# Patient Record
Sex: Female | Born: 1965 | Race: White | Hispanic: No | Marital: Married
Health system: Midwestern US, Academic
[De-identification: ages and names within clinical notes are randomized; demographics above are authoritative.]

---

## 2018-01-30 ENCOUNTER — Encounter: Admit: 2018-01-30 | Discharge: 2018-01-30

## 2018-01-30 ENCOUNTER — Inpatient Hospital Stay: Admit: 2018-01-30 | Discharge: 2018-01-30

## 2018-01-30 ENCOUNTER — Inpatient Hospital Stay
Admit: 2018-01-30 | Discharge: 2018-01-31 | Disposition: A | Discharge status: Home / Self Care | Source: Other Acute Inpatient Hospital | Attending: Neurology | Admitting: Neurology

## 2018-01-30 DIAGNOSIS — R531 Weakness: ICD-10-CM

## 2018-01-30 MED ORDER — ASPIRIN 81 MG PO CHEW
81 mg | Freq: Every day | ORAL | 0 refills | Status: DC
Start: 2018-01-30 — End: 2018-01-31
  Administered 2018-01-31: 14:00:00 81 mg via ORAL

## 2018-01-30 MED ORDER — NICOTINE 21 MG/24 HR TD PT24
1 | Freq: Every day | TRANSDERMAL | 0 refills | Status: DC
Start: 2018-01-30 — End: 2018-01-31
  Administered 2018-01-30 – 2018-01-31 (×2): 1 via TRANSDERMAL

## 2018-01-30 MED ORDER — IOHEXOL 350 MG IODINE/ML IV SOLN
100 mL | Freq: Once | INTRAVENOUS | 0 refills | Status: CP
Start: 2018-01-30 — End: ?
  Administered 2018-01-30: 20:00:00 100 mL via INTRAVENOUS

## 2018-01-30 MED ORDER — TRAZODONE 50 MG PO TAB
100 mg | Freq: Every evening | ORAL | 0 refills | Status: DC
Start: 2018-01-30 — End: 2018-01-31
  Administered 2018-01-31: 02:00:00 100 mg via ORAL

## 2018-01-30 MED ORDER — PERFLUTREN LIPID MICROSPHERES 1.1 MG/ML IV SUSP
1-20 mL | Freq: Once | INTRAVENOUS | 0 refills | Status: CP | PRN
Start: 2018-01-30 — End: ?
  Administered 2018-01-30: 22:00:00 2 mL via INTRAVENOUS

## 2018-01-30 MED ORDER — SODIUM CHLORIDE 0.9 % IJ SOLN
50 mL | Freq: Once | INTRAVENOUS | 0 refills | Status: CP
Start: 2018-01-30 — End: ?
  Administered 2018-01-30: 20:00:00 50 mL via INTRAVENOUS

## 2018-01-30 MED ORDER — BUPROPION XL 150 MG PO TB24
300 mg | Freq: Every day | ORAL | 0 refills | Status: DC
Start: 2018-01-30 — End: 2018-01-31
  Administered 2018-01-30 – 2018-01-31 (×2): 300 mg via ORAL

## 2018-01-30 MED ORDER — LORAZEPAM 2 MG/ML IJ SOLN
.5 mg | Freq: Once | INTRAVENOUS | 0 refills | Status: CP
Start: 2018-01-30 — End: ?
  Administered 2018-01-30: 23:00:00 0.5 mg via INTRAVENOUS

## 2018-01-30 MED ORDER — ENOXAPARIN 40 MG/0.4 ML SC SYRG
40 mg | Freq: Every day | SUBCUTANEOUS | 0 refills | Status: DC
Start: 2018-01-30 — End: 2018-01-31
  Administered 2018-01-30 – 2018-01-31 (×2): 40 mg via SUBCUTANEOUS

## 2018-01-30 MED ORDER — ACETAMINOPHEN 325 MG PO TAB
650 mg | ORAL | 0 refills | Status: DC | PRN
Start: 2018-01-30 — End: 2018-01-31
  Administered 2018-01-31 (×2): 650 mg via ORAL

## 2018-01-31 ENCOUNTER — Encounter: Admit: 2018-01-30 | Discharge: 2018-01-30

## 2018-01-31 ENCOUNTER — Encounter: Admit: 2018-01-31 | Discharge: 2018-01-31

## 2018-01-31 DIAGNOSIS — R471 Dysarthria and anarthria: ICD-10-CM

## 2018-01-31 DIAGNOSIS — M4722 Other spondylosis with radiculopathy, cervical region: Principal | ICD-10-CM

## 2018-01-31 DIAGNOSIS — R29810 Facial weakness: ICD-10-CM

## 2018-01-31 DIAGNOSIS — F17210 Nicotine dependence, cigarettes, uncomplicated: ICD-10-CM

## 2018-01-31 DIAGNOSIS — Z66 Do not resuscitate: ICD-10-CM

## 2018-01-31 DIAGNOSIS — R292 Abnormal reflex: ICD-10-CM

## 2018-01-31 DIAGNOSIS — R29704 NIHSS score 4: ICD-10-CM

## 2018-01-31 LAB — CBC: Lab: 3.9 K/UL — ABNORMAL LOW (ref >60)

## 2018-01-31 LAB — HEMOGLOBIN A1C: Lab: 5.9 % (ref 4.0–6.0)

## 2018-01-31 LAB — LIPID PROFILE: Lab: 196 mg/dL — ABNORMAL LOW (ref <200)

## 2018-01-31 LAB — BASIC METABOLIC PANEL: Lab: 140 MMOL/L — ABNORMAL LOW (ref <150)

## 2018-01-31 MED ORDER — ASPIRIN 81 MG PO CHEW
81 mg | ORAL_TABLET | Freq: Every day | ORAL | 3 refills | Status: AC
Start: 2018-01-31 — End: 2019-12-09

## 2018-01-31 MED ORDER — GABAPENTIN 300 MG PO CAP
300 mg | ORAL_CAPSULE | Freq: Every evening | ORAL | 3 refills | Status: AC
Start: 2018-01-31 — End: 2018-01-31

## 2018-01-31 MED ORDER — ASPIRIN 81 MG PO CHEW
81 mg | ORAL_TABLET | Freq: Every day | ORAL | 3 refills | Status: AC
Start: 2018-01-31 — End: 2018-01-31

## 2018-01-31 MED ORDER — NICOTINE 21 MG/24 HR TD PT24
1 | MEDICATED_PATCH | Freq: Every day | TRANSDERMAL | 2 refills | Status: AC
Start: 2018-01-31 — End: 2018-01-31

## 2018-01-31 MED ORDER — HYDROXYZINE HCL 25 MG PO TAB
100 mg | Freq: Once | ORAL | 0 refills | Status: CP
Start: 2018-01-31 — End: ?
  Administered 2018-01-31: 17:00:00 100 mg via ORAL

## 2018-01-31 MED ORDER — GABAPENTIN 300 MG PO CAP
300 mg | ORAL_CAPSULE | Freq: Every evening | ORAL | 3 refills | Status: AC
Start: 2018-01-31 — End: 2019-12-09
  Filled 2018-01-31 (×2): qty 90, 33d supply, fill #1

## 2018-01-31 MED ORDER — NICOTINE 21 MG/24 HR TD PT24
1 | MEDICATED_PATCH | Freq: Every day | TRANSDERMAL | 2 refills | Status: AC
Start: 2018-01-31 — End: 2019-12-04

## 2018-02-01 ENCOUNTER — Encounter: Admit: 2018-02-01 | Discharge: 2018-02-01

## 2018-02-02 ENCOUNTER — Encounter: Admit: 2018-02-02 | Discharge: 2018-02-02

## 2018-08-18 ENCOUNTER — Encounter: Admit: 2018-08-18 | Discharge: 2018-08-18

## 2019-10-11 ENCOUNTER — Encounter: Admit: 2019-10-11 | Discharge: 2019-10-11

## 2019-10-11 NOTE — Progress Notes
Social Work Case Management  Care Transitions    Plan. Social Work was consulted by the Case Management Office to assist Ms. Pinnock with transportation to and from a scheduled ENT office visit on 14 of December.     Interaction. Social Work contacted the individual that initiated the request: Dierdre Forth - Education officer, museum at the [employer]. Baxter Flattery indicated Ms. Bertucci does not have health insurance. Social Work informed Baxter Flattery - Ms. Bertucci will be responsible to pay the office visit fee and other itemized residual costs. Baxter Flattery indicated Ms. Busey cannot afford this.     Social Work also informed Baxter Flattery - Building surveyor at [employer] is emergency only.     Intervention. Considering Ms. Delgrande does not have health insurance - Social Work e-mailed the [employer] to screen for a referral to [employer] - to assist with applying for [place] Medicaid. Baxter Flattery will instruct Ms. Maclaren to contact the ENT clinic to reschedule and seek intermediate medical intervention at a local safety net clinic.     Dinah Beers, LMSW  Outpatient Social Worker  Pager [phone]

## 2019-12-03 ENCOUNTER — Encounter: Admit: 2019-12-03 | Discharge: 2019-12-03

## 2019-12-03 ENCOUNTER — Emergency Department: Admit: 2019-12-03 | Discharge: 2019-12-03

## 2019-12-03 DIAGNOSIS — H7190 Unspecified cholesteatoma, unspecified ear: Secondary | ICD-10-CM

## 2019-12-03 DIAGNOSIS — N760 Acute vaginitis: Secondary | ICD-10-CM

## 2019-12-03 DIAGNOSIS — R45851 Suicidal ideations: Secondary | ICD-10-CM

## 2019-12-03 DIAGNOSIS — B1920 Unspecified viral hepatitis C without hepatic coma: Secondary | ICD-10-CM

## 2019-12-03 LAB — URINALYSIS DIPSTICK
Lab: 6 (ref 5.0–8.0)
Lab: NEGATIVE
Lab: NEGATIVE
Lab: NEGATIVE
Lab: NEGATIVE
Lab: NEGATIVE
Lab: NEGATIVE
Lab: NEGATIVE

## 2019-12-03 LAB — URINALYSIS, MICROSCOPIC

## 2019-12-03 LAB — CBC AND DIFF
Lab: 0 % (ref 0–2)
Lab: 32 pg (ref 26–34)
Lab: 9.5 10*3/uL (ref 4.5–11.0)

## 2019-12-03 MED ORDER — METRONIDAZOLE 500 MG PO TAB
500 mg | ORAL_TABLET | Freq: Two times a day (BID) | ORAL | 0 refills | Status: DC
Start: 2019-12-03 — End: 2019-12-09

## 2019-12-03 MED ORDER — THIAMINE/FOLIC ACID IVPB
Freq: Once | INTRAVENOUS | 0 refills | Status: CP
Start: 2019-12-03 — End: ?
  Administered 2019-12-03 (×3): 50.000 mL via INTRAVENOUS

## 2019-12-03 MED ORDER — ACETAMINOPHEN 325 MG PO TAB
650 mg | Freq: Once | ORAL | 0 refills | Status: CP
Start: 2019-12-03 — End: ?
  Administered 2019-12-04: 650 mg via ORAL

## 2019-12-03 MED ORDER — METRONIDAZOLE 500 MG PO TAB
500 mg | Freq: Once | ORAL | 0 refills | Status: CP
Start: 2019-12-03 — End: ?
  Administered 2019-12-04: 03:00:00 500 mg via ORAL

## 2019-12-03 MED ORDER — LACTATED RINGERS IV SOLP
1000 mL | Freq: Once | INTRAVENOUS | 0 refills | Status: CP
Start: 2019-12-03 — End: ?
  Administered 2019-12-03: 1000 mL via INTRAVENOUS

## 2019-12-03 MED ORDER — LORAZEPAM 1 MG PO TAB
.5 mg | Freq: Once | ORAL | 0 refills | Status: CP
Start: 2019-12-03 — End: ?
  Administered 2019-12-04: 03:00:00 0.5 mg via ORAL

## 2019-12-03 NOTE — ED Provider Notes
Jennifer Hancock is a 54 y.o. female.    Chief Complaint:  Chief Complaint   Patient presents with   ? Suicidal     pt feeling suicidal and sent by therapist; couldn't afford antidepressants; having UTIs        History of Present Illness:  Jennifer Hancock is a 54 y.o. female with a history of hepatitis C who presents to the emergency department for suicidal ideation. Patient reports recently feeling suicidal with plan to overdose on medications, however, she states she does not have any medications at home she would be able to overdose on. She expresses wanting to be admitted to [facility] because a short term admission will not be long enough. Patient denies homicidal ideation. She does report hearing voices that are calling her a piece of shit. She has been prescribed wellbutrin, xanax, buspar and trazodone for her psychiatric illnesses in the past but has not been able to afford them the past 1 month. Patient reports drinking 4-8 alcoholic shooters per day for the past 3 weeks. She denies history of alcohol withdrawal or alcohol withdrawal seizures. Patient has history of IV drug use but denies this for a few years. Additionally, she complains of UTIs due to urinary frequency and dull throbbing pelvic pain for 1-2 weeks. She denies vaginal discharge. Patient was recently treated for trichomonas and states she could have an STD again.       History provided by:  Patient  Language interpreter used: No        Review of Systems:  Review of Systems   Constitutional: Negative for fever.   HENT: Negative for sore throat.    Eyes: Negative for visual disturbance.   Respiratory: Negative for cough and shortness of breath.    Cardiovascular: Negative for chest pain.   Gastrointestinal: Negative for abdominal pain, diarrhea, nausea and vomiting.   Genitourinary: Positive for flank pain (right), frequency and pelvic pain. Negative for dysuria, vaginal bleeding and vaginal discharge. Musculoskeletal: Negative for back pain.   Skin: Negative for rash.   Neurological: Negative for headaches.   Psychiatric/Behavioral: Positive for dysphoric mood and suicidal ideas. Negative for hallucinations and self-injury.       Allergies:  Ciprofloxacin (bulk)    Past Medical History:  Medical History:   Diagnosis Date   ? Cholesteatoma    ? Hepatitis C        Past Surgical History:  Surgical History:   Procedure Laterality Date   ? INNER EAR SURGERY         Pertinent medical/surgical history reviewed  Medical History:   Diagnosis Date   ? Cholesteatoma    ? Hepatitis C      Surgical History:   Procedure Laterality Date   ? INNER EAR SURGERY         Social History:  Social History     Tobacco Use   ? Smoking status: Current Every Day Smoker     Packs/day: 1.50   Substance Use Topics   ? Alcohol use: No   ? Drug use: No     Social History     Substance and Sexual Activity   Drug Use No       Family History:  History reviewed. No pertinent family history.    Vitals:  ED Vitals    Date and Time T BP P RR SPO2P SPO2 User   12/03/19 1900 -- 131/97 88 -- 81 100 % BR   12/03/19 1859 -- -- --  18 PER MINUTE -- -- BR   12/03/19 1830 -- 130/87 84 13 PER MINUTE 87 100 % NH   12/03/19 1800 -- -- 101 15 PER MINUTE 99 99 % NH   12/03/19 1730 -- 123/88 88 16 PER MINUTE 88 99 % NH   12/03/19 1700 -- 142/95 94 -- 94 98 % NH   12/03/19 1630 -- 132/90 99 -- 99 99 % NH   12/03/19 1606 -- 129/92 101 23 PER MINUTE 99 99 % NH   12/03/19 1544 36.3 ?C (97.4 ?F) 127/97 112 18 PER MINUTE -- 100 % SR          Physical Exam:  Physical Exam  Vitals signs and nursing note reviewed.   Constitutional:       Appearance: Normal appearance. She is normal weight.   HENT:      Head: Normocephalic and atraumatic.      Right Ear: External ear normal.      Left Ear: External ear normal.      Nose: Nose normal. No congestion or rhinorrhea.      Mouth/Throat:      Mouth: Mucous membranes are moist. Pharynx: No oropharyngeal exudate or posterior oropharyngeal erythema.   Eyes:      Extraocular Movements: Extraocular movements intact.      Conjunctiva/sclera: Conjunctivae normal.   Neck:      Musculoskeletal: Neck supple.   Cardiovascular:      Rate and Rhythm: Normal rate and regular rhythm.      Pulses: Normal pulses.      Heart sounds: Normal heart sounds.   Pulmonary:      Effort: Pulmonary effort is normal.      Breath sounds: Normal breath sounds. No wheezing, rhonchi or rales.   Abdominal:      General: Bowel sounds are normal. There is no distension.      Palpations: Abdomen is soft.      Tenderness: There is abdominal tenderness (diffuse, lower aspect). There is no guarding or rebound.   Genitourinary:     General: Normal vulva.      Vagina: Vaginal discharge present.      Comments: Slight bilateral adnexal ttp, no cmt   Musculoskeletal:         General: No deformity.      Right lower leg: No edema.      Left lower leg: No edema.   Skin:     General: Skin is warm and dry.   Neurological:      Mental Status: She is oriented to person, place, and time. Mental status is at baseline.   Psychiatric:         Attention and Perception: She perceives auditory (questionable) hallucinations.         Mood and Affect: Mood is depressed.         Thought Content: Thought content includes suicidal ideation. Thought content does not include homicidal ideation. Thought content includes suicidal plan. Thought content does not include homicidal plan.         Laboratory Results:  Labs Reviewed   URINALYSIS DIPSTICK - Abnormal       Result Value Ref Range Status    Color,UA STRAW   Final    Turbidity,UA CLEAR  CLEAR-CLEAR Final    Specific Gravity-Urine 1.006  1.003 - 1.035 Final    pH,UA 6.0  5.0 - 8.0 Final    Protein,UA NEG  NEG-NEG Final    Glucose,UA NEG  NEG-NEG Final  Ketones,UA NEG  NEG-NEG Final    Bilirubin,UA NEG  NEG-NEG Final    Blood,UA 2+ (*) NEG-NEG Final    Urobilinogen,UA NORMAL  NORM-NORMAL Final Nitrite,UA NEG  NEG-NEG Final    Leukocytes,UA NEG  NEG-NEG Final    Urine Ascorbic Acid, UA NEG  NEG-NEG Final   CBC AND DIFF - Abnormal    White Blood Cells 9.5  4.5 - 11.0 K/UL Final    RBC 4.47  4.0 - 5.0 M/UL Final    Hemoglobin 14.5  12.0 - 15.0 GM/DL Final    Hematocrit 45.4  36 - 45 % Final    MCV 92.9  80 - 100 FL Final    MCH 32.4  26 - 34 PG Final    MCHC 34.9  32.0 - 36.0 G/DL Final    RDW 09.8  11 - 15 % Final    Platelet Count 255  150 - 400 K/UL Final    MPV 7.9  7 - 11 FL Final    Neutrophils 72  41 - 77 % Final    Lymphocytes 19 (*) 24 - 44 % Final    Monocytes 8  4 - 12 % Final    Eosinophils 1  0 - 5 % Final    Basophils 0  0 - 2 % Final    Absolute Neutrophil Count 6.85  1.8 - 7.0 K/UL Final    Absolute Lymph Count 1.77  1.0 - 4.8 K/UL Final    Absolute Monocyte Count 0.71  0 - 0.80 K/UL Final    Absolute Eosinophil Count 0.13  0 - 0.45 K/UL Final    Absolute Basophil Count 0.02  0 - 0.20 K/UL Final    MDW (Monocyte Distribution Width) 15.7  <20.7 Final   COVID-19 (SARS-COV-2) PCR   CHLAM/NG PCR SWAB   DIRECT EXAM (WET PREP)    Battery Name DIRECT EXAM,WET PREP   Final    Report Status FINAL 12/03/2019   Final    Specimen Description CERVICAL     Final    Special Requests NONE   Final    Direct Exam     Final    Value: MODERATE  CLUE CELLS      Direct Exam NO YEAST SEEN   Final    Direct Exam NO TRICHOMONAS SEEN   Final   CULTURE-URINE W/SENSITIVITY   URINALYSIS, MICROSCOPIC    WBCs,UA NONE  0 - 2 /HPF Final    RBCs,UA 0-2  0 - 3 /HPF Final    Squamous Epithelial Cells 0-2  0 - 5 Final   COMPREHENSIVE METABOLIC PANEL    Sodium 139  119 - 147 MMOL/L Final    Potassium 4.1  3.5 - 5.1 MMOL/L Final    Chloride 103  98 - 110 MMOL/L Final    Glucose 98  70 - 100 MG/DL Final    Blood Urea Nitrogen 8  7 - 25 MG/DL Final    Creatinine 1.47  0.4 - 1.00 MG/DL Final    Calcium 9.7  8.5 - 10.6 MG/DL Final    Total Protein 7.7  6.0 - 8.0 G/DL Final    Total Bilirubin 0.3  0.3 - 1.2 MG/DL Final Albumin 4.6  3.5 - 5.0 G/DL Final    Alk Phosphatase 65  25 - 110 U/L Final    AST (SGOT) 21  7 - 40 U/L Final    CO2 30  21 - 30 MMOL/L Final    ALT (  SGPT) 27  7 - 56 U/L Final    Anion Gap 6  3 - 12 Final    eGFR Non African American >60  >60 mL/min Final    eGFR African American >60  >60 mL/min Final   HIV, STAT    HIV I/II, Rapid NEG   Final   MAGNESIUM    Magnesium 2.1  1.6 - 2.6 mg/dL Final   SYPHILIS AB SCREEN    Syphilis AB, Total Negative  NEGSY-Negative Final   URINE CLEAR TOP TUBE   POC URINE PREGNANCY   POC URINE PREGNANCY     Urine Pregnancy  Urine Pregnancy: (S) Negative  QC: Acceptable    Radiology Interpretation:    No orders to display         EKG:  1824  12 lead EKG shows regular rate 83 and sinus rhythm.  PR interval, QRS duration, QT interval within normal limits.   No ST segment elevations or depressions.    Interpretation: normal sinus rhythm.      ED Course: Patient seen and evaluated by the resident and attending physician.  Pertinent physical exam findings described above.  IV access obtained and patient remained on cardiopulmonary telemetry while in the emergency department.  Vitals initially remarkable for tachycardia to the low 100s and elevated blood pressures to 140s/90s.  Labs drawn and significant for: No evidence of leukocytosis or anemia, no overt electrolyte abnormality, EKG without evidence of arrhythmia or ischemia.  Urine pregnancy negative.  UA without evidence of infection.  Covid swab obtained.  Syphilis and rapid HIV negative.  Pelvic exam performed due to endorsement of lower pelvic pain with increasing vaginal odor and concern for STI as patient was recently treated for trichomonas and has recently had unprotected sexual intercourse with males.  Wet prep positive for clue cells.  Patient provided first dose of Flagyl?GC pending.  Patient to be discharged with prescription for Flagyl to be taken twice daily for the next 7 days for her bacterial vaginosis.  Patient seen and evaluated by our psychiatric liaison services.  Please refer to their documentation for further information.  At this time, patient requiring further resources to be offered an inpatient psychiatric facility.  Patient medically appropriate for inpatient psychiatric admission at this time.  Patient informed of plan of care and expressed agreement with and understanding of plan.        ED Scoring:                             MDM  Reviewed: vitals, nursing note and previous chart  Interpretation: labs and ECG        Facility Administered Meds:  Medications   metroNIDAZOLE (FLAGYL) tablet 500 mg (has no administration in time range)   lactated ringers infusion (0 mL Intravenous Infusion Stopped 12/03/19 1856)   thiamine (VITAMIN B-1) 100 mg, folic acid 1 mg in sodium chloride 0.9% (NS) 50 mL IVPB ( Intravenous Infusion Stopped 12/03/19 1856) acetaminophen (TYLENOL) tablet 650 mg (650 mg Oral Given 12/03/19 1822)         Clinical Impression:  Clinical Impression   Bacterial vaginosis   Suicidal ideation       Disposition/Follow up  ED Disposition     None        No follow-up provider specified.    Medications:  New Prescriptions    No medications on file       Procedure Notes:  Procedures      Attestation / Supervision:  Johny Blamer, am scribing for and in the presence of Era Bumpers, MD.      Oliver Barre    Attestation / Supervision Note concerning Natale R Juran: I personally performed the E/M including history, physical exam, and MDM.    Kathryne Sharper, MD

## 2019-12-03 NOTE — ED Notes
54 yo female presents to ED with CC of SI for 1 week. Pt reports wanting to take a bottle of pills. Pt reports not taking meds since 10/26/19 due to money. Pt denies substance use other than two shots of alcohol 12/02/19 and for the past 2-3 weeks. Pt reports drinking approximately 1 pint or more a day each day for 3 weeks. Belongings bagged and placed at nurses station.     PMH of anxiety, bipolar, PTSD, polysubstance.    Pt A&O x 4, connected to monitor, VSS, patent airway, breathing non-labored, and pulses palpable. RN notes bed is in lowest position, side rails up, and call light within reach.     Belongings: Blue jeans, black socks, purse, blue hoodie, pink long sleeve shirt, black jacket, glasses, cell phone, wallet.

## 2019-12-03 NOTE — ED Notes
Pt reports emergency contact's Leilani Merl [phone]) is emergency contact.    Pt requests Jule Ser [phone]) is therapist and can have information.      Pt verbally requests both parties be updated if they call.

## 2019-12-04 ENCOUNTER — Encounter: Admit: 2019-12-04 | Discharge: 2019-12-04

## 2019-12-04 DIAGNOSIS — B1920 Unspecified viral hepatitis C without hepatic coma: Secondary | ICD-10-CM

## 2019-12-04 DIAGNOSIS — H7190 Unspecified cholesteatoma, unspecified ear: Secondary | ICD-10-CM

## 2019-12-04 LAB — COMPREHENSIVE METABOLIC PANEL
Lab: 0.3 mg/dL (ref 0.3–1.2)
Lab: 0.7 mg/dL (ref 0.4–1.00)
Lab: 103 MMOL/L (ref 98–110)
Lab: 139 MMOL/L (ref 137–147)
Lab: 21 U/L (ref 7–40)
Lab: 4.1 MMOL/L (ref 3.5–5.1)
Lab: 4.6 g/dL (ref 3.5–5.0)
Lab: 6 K/UL (ref 3–12)
Lab: 65 U/L — ABNORMAL LOW (ref 25–110)
Lab: 7.7 g/dL (ref 6.0–8.0)
Lab: 9.7 mg/dL (ref 8.5–10.6)
Lab: 98 mg/dL (ref 70–100)
Lab: >60 mL/min — ABNORMAL HIGH (ref >60)

## 2019-12-04 LAB — DIRECT EXAM (WET PREP)

## 2019-12-04 LAB — CANNABINOIDS-URINE RANDOM: Lab: NEGATIVE

## 2019-12-04 LAB — OPIATES 300 OR GREATER-URINE RANDOM: Lab: NEGATIVE

## 2019-12-04 LAB — BARBITURATES-URINE RANDOM: Lab: NEGATIVE

## 2019-12-04 LAB — COCAINE-URINE RANDOM: Lab: NEGATIVE

## 2019-12-04 LAB — BENZODIAZEPINES-URINE RANDOM: Lab: POSITIVE — AB

## 2019-12-04 LAB — OXYCODONE URINE SCREEN: Lab: NEGATIVE

## 2019-12-04 LAB — AMPHETAMINES-URINE RANDOM: Lab: NEGATIVE

## 2019-12-04 LAB — METHADONE-URINE SCREEN: Lab: NEGATIVE

## 2019-12-04 LAB — COVID-19 (SARS-COV-2) PCR

## 2019-12-04 LAB — PHENCYCLIDINES-URINE RANDOM: Lab: NEGATIVE

## 2019-12-04 MED ORDER — TRAZODONE 100 MG PO TAB
100 mg | Freq: Every evening | ORAL | 0 refills | Status: DC | PRN
Start: 2019-12-04 — End: 2019-12-09
  Administered 2019-12-05 – 2019-12-09 (×5): 100 mg via ORAL

## 2019-12-04 MED ORDER — NICOTINE 21 MG/24 HR TD PT24
1 | Freq: Every day | TRANSDERMAL | 0 refills | Status: DC
Start: 2019-12-04 — End: 2019-12-09
  Administered 2019-12-05 – 2019-12-09 (×5): 1 via TRANSDERMAL

## 2019-12-04 MED ORDER — HYDROXYZINE HCL 50 MG PO TAB
50 mg | ORAL | 0 refills | Status: DC | PRN
Start: 2019-12-04 — End: 2019-12-09
  Administered 2019-12-04 – 2019-12-09 (×10): 50 mg via ORAL

## 2019-12-04 MED ORDER — TRAMADOL 50 MG PO TAB
50 mg | ORAL | 0 refills | Status: DC | PRN
Start: 2019-12-04 — End: 2019-12-09
  Administered 2019-12-05 – 2019-12-09 (×14): 50 mg via ORAL

## 2019-12-04 MED ORDER — METRONIDAZOLE 500 MG PO TAB
500 mg | Freq: Two times a day (BID) | ORAL | 0 refills | Status: DC
Start: 2019-12-04 — End: 2019-12-09
  Administered 2019-12-04 – 2019-12-09 (×11): 500 mg via ORAL

## 2019-12-04 MED ORDER — HYDROXYZINE HCL 50 MG PO TAB
25 mg | ORAL | 0 refills | Status: DC | PRN
Start: 2019-12-04 — End: 2019-12-04
  Administered 2019-12-04: 13:00:00 25 mg via ORAL

## 2019-12-04 MED ORDER — NICOTINE (POLACRILEX) 4 MG BU GUM
4 mg | BUCCAL | 0 refills | Status: DC | PRN
Start: 2019-12-04 — End: 2019-12-09
  Administered 2019-12-04 – 2019-12-09 (×14): 4 mg via BUCCAL

## 2019-12-04 MED ORDER — ACETAMINOPHEN 325 MG PO TAB
650 mg | ORAL | 0 refills | Status: DC | PRN
Start: 2019-12-04 — End: 2019-12-09
  Administered 2019-12-05: 650 mg via ORAL

## 2019-12-04 MED ORDER — BUSPIRONE 10 MG PO TAB
10 mg | Freq: Two times a day (BID) | ORAL | 0 refills | Status: DC
Start: 2019-12-04 — End: 2019-12-09
  Administered 2019-12-04 – 2019-12-09 (×11): 10 mg via ORAL

## 2019-12-04 MED ORDER — CALCIUM CARBONATE 200 MG CALCIUM (500 MG) PO CHEW
500 mg | ORAL | 0 refills | Status: DC | PRN
Start: 2019-12-04 — End: 2019-12-09

## 2019-12-04 MED ORDER — POLYETHYLENE GLYCOL 3350 17 GRAM PO PWPK
1 | Freq: Every day | ORAL | 0 refills | Status: DC | PRN
Start: 2019-12-04 — End: 2019-12-09

## 2019-12-04 MED ORDER — BUPROPION XL 150 MG PO TB24
150 mg | Freq: Every day | ORAL | 0 refills | Status: DC
Start: 2019-12-04 — End: 2019-12-09
  Administered 2019-12-04 – 2019-12-09 (×6): 150 mg via ORAL

## 2019-12-04 MED ORDER — TRAZODONE 50 MG PO TAB
50 mg | Freq: Every evening | ORAL | 0 refills | Status: DC | PRN
Start: 2019-12-04 — End: 2019-12-04

## 2019-12-04 NOTE — ED Notes
RN escoreted Pt to [id number]

## 2019-12-04 NOTE — ED Notes
Psychiatric Liaison Services Evaluation:    Name: Jennifer Hancock        MRN: [id number]          DOB: 1966-06-26          Age: 54 y.o.  Admission Date: 12/03/2019             LOS: 0 days      Gender:  Female  Referred by:  Self & Therapist  Accompanied by: Self    Chief Complaint:  SI    History of Present Illness:  Pt presented to the ED with complaints of SI x1 week with a plan to overdose on pills.  Pt reported that she has also been drinking between 1-4 drinks daily.     Pt stated that she had to stop taking her medications from 10/26/19 to present due to financial inability to pay for her meds.  Pt stated that she has been getting increasingly depressed over the past month and stated that within the past week she has not showered or cared for herself.  Pt stated that she has been drinking 1-4 shooters daily to cope.  Pt reported that she has been in contact with her therapist who has been encouraging her to come to the hospital for help.      Pt reported that she completed an inpatient substance use program approximately 2 years ago and since then has been clean from drugs.  Pt stated that she occasionally has consumed alcohol but noted that her usage has increased to daily after she had to stop her meds.  Pt reported that she has a perforated ear drum which is causing her pain. Pt reported that she took one percocet yesterday due to the pain in her ear.  Pt denied any other substance use/abuse.      Psych History: Pt reported psychiatric admissions dating back to age 6.  Pt stated that she has been to OSH on 3 occasions, last in 2013.  Pt stated that since 2013 she has been to [facility] 2x and [facility] 1x.  Pt reported that she follows with [facility] and her therapist is Jule Ser [phone].     Past Medical History:   Medical History:   Diagnosis Date   ? Cholesteatoma    ? Hepatitis C        Past Surgical History:   Surgical History:   Procedure Laterality Date ? INNER EAR SURGERY         Substance Abuse History:  Social History     Tobacco Use   ? Smoking status: Current Every Day Smoker     Packs/day: 1.50   Substance Use Topics   ? Alcohol use: No   ? Drug use: No     Social History     Substance and Sexual Activity   Drug Use No       Family History:  History reviewed. No pertinent family history.    Allergies:  Allergies   Allergen Reactions   ? Ciprofloxacin (Bulk) HIVES       Medications:    Pt currently only has her Xanax prescription and has been off her medications since 10/26/19.   Pt is reportedly supposed to be taking the following:  Wellbutrin 150mg  BID  Trazodone 200mg  at HS  Buspar 15 BID  Tramadol 50mg  Q6  Xanax 0.5mg  TID    Social History:   Marital Status: Single    Living Situation: Lives independently  Developmental History:  Unremarkable     Mental Status Examination:  Flow Of Thought: Goal Directed  Intellect: Normal  Sensorium: Normal  Memory: Normal  Insight / Judgment: Fair Judgment, Fair Insight  General Appearance: Sad, Worried, Disheveled, Poor hygiene  Behavior: Restless, Calm, Cooperative  Motor Activity: Normal  Speech: Normal  Mood / Affect: Anxious, Depressed Mood, Congruent  Content Of Thought: Suidical Plan, Denies Homicidal Thoughts, Ideas of Hopelessness, Ideas of Worthlessness, Denies Visual Hallucinations, Denies Audio Hallucinations        Conduct Disturbance: Normal  Eating/Sleep Disturbance: Sleep Disturbance with Onset and/or Latency, Decreased Appetite  Interview Behavior: Normal  Med/TX Compliance: Meds-usually, [place]-no    Suicide Risk Initial Screening:  Suicide Risk - [place] Suicide Severity Rating Scale  1. In the past month have you wished you were dead or wished you could go to sleep and not wake up?: Yes  2. In the past month have you actually had any thoughts of killing yourself?: Yes  3. In the past month have you been thinking about how you might kill yourself?: Yes 4. In the past month have you had these thoughts and had some intention of acting on them?: Yes  5. In the past month have you started to work out or worked out the details of how to kill yourself? Do you intend to carry out this plan?: Yes  6. In your lifetime have you ever done anything, started to do anything, or prepared to do anything to end your life?: Yes  6a. How long ago did you do any of these?: Over a year ago  Suicide Risk Level: High Risk :     Suicide Risk Re-Screening:  @FLOW (16109)    Suicide Risk Assesssment: Moderate    Disposition: Pt is voluntary for inpatient psychiatric admission.      Reviewed: Martie Round     Attending Physician:  Melida Gimenez

## 2019-12-06 MED ORDER — RISPERIDONE 1 MG PO TAB
1 mg | Freq: Every evening | ORAL | 0 refills | Status: DC
Start: 2019-12-06 — End: 2019-12-07
  Administered 2019-12-07: 03:00:00 1 mg via ORAL

## 2019-12-06 MED ORDER — LOPERAMIDE 2 MG PO CAP
2 mg | ORAL | 0 refills | Status: DC | PRN
Start: 2019-12-06 — End: 2019-12-09
  Administered 2019-12-06: 23:00:00 2 mg via ORAL

## 2019-12-06 MED ORDER — RISPERIDONE 1 MG PO TAB
1 mg | Freq: Once | ORAL | 0 refills | Status: CP
Start: 2019-12-06 — End: ?
  Administered 2019-12-06: 18:00:00 1 mg via ORAL

## 2019-12-07 MED ORDER — RISPERIDONE 1 MG PO TAB
1 mg | Freq: Two times a day (BID) | ORAL | 0 refills | Status: DC
Start: 2019-12-07 — End: 2019-12-09
  Administered 2019-12-07 – 2019-12-09 (×5): 1 mg via ORAL

## 2019-12-09 ENCOUNTER — Encounter: Admit: 2019-12-09 | Discharge: 2019-12-09 | Payer: PRIVATE HEALTH INSURANCE

## 2019-12-09 MED ORDER — HYDROXYZINE HCL 50 MG PO TAB
50 mg | ORAL_TABLET | ORAL | 0 refills | 30.00000 days | Status: DC | PRN
Start: 2019-12-09 — End: 2020-05-26
  Filled 2019-12-09: qty 30, 8d supply, fill #1

## 2019-12-09 MED ORDER — RISPERIDONE 1 MG PO TAB
1 mg | ORAL_TABLET | Freq: Two times a day (BID) | ORAL | 0 refills | Status: DC
Start: 2019-12-09 — End: 2020-05-26
  Filled 2019-12-09: qty 60, 30d supply, fill #1

## 2019-12-09 MED ORDER — NICOTINE (POLACRILEX) 4 MG BU GUM
4 mg | BUCCAL | 0 refills | Status: DC | PRN
Start: 2019-12-09 — End: 2020-05-26

## 2019-12-09 MED ORDER — NICOTINE 21 MG/24 HR TD PT24
1 | MEDICATED_PATCH | Freq: Every day | TRANSDERMAL | 0 refills | Status: DC
Start: 2019-12-09 — End: 2020-05-26

## 2019-12-09 MED ORDER — BUPROPION XL 150 MG PO TB24
150 mg | ORAL_TABLET | Freq: Every day | ORAL | 0 refills | Status: DC
Start: 2019-12-09 — End: 2020-05-26
  Filled 2019-12-09: qty 30, 30d supply, fill #1

## 2019-12-09 MED ORDER — METRONIDAZOLE 500 MG PO TAB
500 mg | ORAL_TABLET | Freq: Two times a day (BID) | ORAL | 0 refills | Status: DC
Start: 2019-12-09 — End: 2020-05-26
  Filled 2019-12-09: qty 3, 2d supply, fill #1

## 2019-12-09 MED ORDER — TRAMADOL 50 MG PO TAB
50 mg | ORAL_TABLET | ORAL | 0 refills | Status: AC | PRN
Start: 2019-12-09 — End: ?
  Filled 2019-12-09: qty 45, 12d supply, fill #1

## 2019-12-11 ENCOUNTER — Encounter: Admit: 2019-12-11 | Discharge: 2019-12-11

## 2019-12-11 NOTE — Telephone Encounter
No answer, no identifying information, no voicemail left

## 2019-12-20 ENCOUNTER — Encounter: Admit: 2019-12-20 | Discharge: 2019-12-20

## 2019-12-20 NOTE — Telephone Encounter
Confirmation this is the pt the call is intended for (name/DOB): Yes     Any questions about DC instructions? Yes     Did you have any difficulty filling Rx's? States initially but she was able to get the ones that were needed     Any questions about follow-up appointments? States that she was able to follow up with dr and therapist     Name of case manager to follow up with:     Did you receive all items at discharge that you came in with?:  Yes    Any other questions/comments:

## 2020-02-03 ENCOUNTER — Ambulatory Visit: Admit: 2020-02-03 | Discharge: 2020-02-03 | Payer: MEDICAID | Primary: Family

## 2020-02-03 ENCOUNTER — Encounter: Admit: 2020-02-03 | Discharge: 2020-02-03 | Payer: MEDICAID | Primary: Family

## 2020-02-03 MED ORDER — AMOXICILLIN-POT CLAVULANATE 875-125 MG PO TAB
1 | ORAL_TABLET | Freq: Two times a day (BID) | ORAL | 0 refills | 7.00000 days | Status: AC
Start: 2020-02-03 — End: ?

## 2020-02-03 MED ORDER — SULFACETAMIDE SODIUM 10 % OP DROP
2-3 [drp] | Freq: Two times a day (BID) | OPHTHALMIC | 0 refills | 25.00000 days | Status: DC
Start: 2020-02-03 — End: 2020-05-26

## 2020-02-03 NOTE — Progress Notes
No hearing test today per Dr. Staecker.

## 2020-02-03 NOTE — Progress Notes
Date of Service: 02/03/2020    Subjective:             Jennifer Hancock is a 54 y.o. female.    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 She has had no relief since her last surgery.   Reports that since that time she has had constant pain and fullness on left ear, reports intermittent ear drainage, most recently says she had bloody drainage for last 2 days, not currently using drops, keeping ears dry. Denies any constant clear drainage from ear or nose. Denies any vertigo. Using tramadol and tylenol for pain without pain relief. She denies recent fevers, chills, neck stiffness or mental status changes.  Denies any history of childhood ear infections or tube placement. Had TM perforation back when she was 19. No family history of hearing loss. No history of head trauma.         Chief Complaint  Ear Evaluation      Medical History:   Diagnosis Date   ? Cholesteatoma    ? Hepatitis C      History reviewed. No pertinent family history.  Allergies   Allergen Reactions   ? Ciprofloxacin (Bulk) HIVES     Surgical History:   Procedure Laterality Date   ? INNER EAR SURGERY       Social History     Socioeconomic History   ? Marital status: Married     Spouse name: Not on file   ? Number of children: Not on file   ? Years of education: Not on file   ? Highest education level: Not on file   Occupational History   ? Not on file   Tobacco Use   ? Smoking status: Current Every Day Smoker     Packs/day: 1.50   Substance and Sexual Activity   ? Alcohol use: No   ? Drug use: No   ? Sexual activity: Not on file   Other Topics Concern   ? Not on file   Social History Narrative   ? Not on file              Review of Systems   Constitutional: Negative.    HENT: Positive for ear discharge, ear pain and hearing loss.    Eyes: Negative.    Respiratory: Negative.    Cardiovascular: Negative.    Gastrointestinal: Negative.    Endocrine: Negative.    Genitourinary: Negative.    Musculoskeletal: Negative.    Skin: Negative.    Allergic/Immunologic: Negative.    Neurological: Negative.    Hematological: Negative.    Psychiatric/Behavioral: Negative.          Objective:         ? buPROPion XL (WELLBUTRIN XL) 150 mg tablet Take one tablet by mouth daily. Do not crush or chew.   ? busPIRone (BUSPAR) 10 mg tablet Take 10 mg by mouth twice daily.   ? hydrOXYzine (ATARAX) 50 mg tablet Take one tablet by mouth every 6 hours as needed.   ? metroNIDAZOLE (FLAGYL) 500 mg tablet Take one tablet by mouth twice daily. Take with food. Do not drink alcohol while on metronidazole.   ? nicotine (NICODERM CQ STEP 1) 21 mg/day patch Apply one patch to top of skin as directed daily. Rotate  patch location.  Indications: stop smoking   ? nicotine polacrilex (NICORETTE) 4 mg gum Place one each inside cheek (side of mouth) every 1 hour as needed. Chew to soften and park in mouth between lip and gum. May use 1 piece per hour, not to exceed 24 per day for 12 weeks. May be used longer, if needed.   ? risperiDONE (RISPERDAL) 1 mg tablet Take one tablet by mouth twice daily.   ? traMADoL (ULTRAM) 50 mg tablet Take one tablet by mouth every 6 hours as needed.   ? traZODone (DESYREL) 100 mg tablet Take 50-100 mg by mouth at bedtime as needed.     Vitals:    02/03/20 1103   BP: 111/78   BP Source: Arm, Left Upper   Patient Position: Sitting   Pulse: 89   Temp: 36.8 ?C (98.2 ?F)   Weight: 70.3 kg (155 lb)   Height: 165.1 cm (65)   PainSc: Ten     Body mass index is 25.79 kg/m?Marland Kitchen     Physical Exam    Cleaned obstructive cerumen with microscope: Left    Neat Appearance: Yes  Oral Communication: Yes  Clear Voice: Yes    Neuro: Left: Right:   CN VII 1 / 6 1 / 6   CN 3, 4, 5, 6 normal normal   CN 9, 10, 11, 12 normal normal       Ext Nose lesion N Nasopharynx lesion N Face Lesions N Spont Nyst N   Int Nose lesion Y, left septal deviation Neck Lesions N Sinus Tenderness N Gaze Nystagmus N   Lip/Teeth/Gum Lesion N Thyroid Lesions N Salivary Gland Lesions N Affect Abnormal N   Oropharynx Lesion N Lymphadenopathy N Edema Extremities N Orientation Abn N   Indirect Laryngoscopy abnl? n Abdominal Tenderness N Noticeable Extremity changes? N Unusual chest expansion with respirations? N                   (SEPARATE PROCEDURE) Otoscopic exam:   After verbal consent was obtained, the patient was placed in a supine position.  Under binocular microscopy the left canal was cleared of debris and drainage using a 5 Fr suction/curette, the underlying left TM was noted to have a large central perforation >50% with notable mucoid drainage from middle ear        Imaging:  CT from 01/30/2018  CTA head:     The ventricles and subarachnoid spaces are normal in size and   configuration. Tiny right caudate head lacunar infarct. The gray white   matter interfaces are otherwise maintained There is no midline shift or   mass effect. There is no evidence of acute intracranial hemorrhage. The   basal cisterns are patent. The calvarium is intact.     The distal internal carotid, vertebral, and basilar arteries are patent   without focal narrowing or occlusion. The anterior, middle, and posterior   cerebral arteries are patent without focal narrowing. No aneurysm or   arteriovenous malformation is identified.     CTA neck:     Minimal calcific plaque of the aortic arch and arch vessel origins without   stenosis. The common carotid and cervical portions of the internal carotid   and vertebral arteries are patent without focal narrowing according to   NASCET criteria. No aneurysm, AVM, or dissection is identified.     The cervical soft tissues are unremarkable. The paranasal sinus and   mastoid air cells are clear. Right concha bullosa is  noted. Opacification   of the left middle ear and left mastoid air cells without obvious   associated osteolysis. Hypoplastic left mastoid air cells The lung apices   are clear. ?Cervical spondylosis resulting in at least moderate to marked   multilevel neural foraminal stenosis. Numerous teeth are missing.   Scattered dental caries in the remaining teeth.     CT Perfusion:     Blood flow, blood volume, and time-dependent maps are symmetric. No   mismatch perfusion defect is identified.     IMPRESSION       CTA head:     1. ?No acute intracranial hemorrhage or mass effect.   2. 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   3. ?Normal CTA of the head without intracranial arterial stenosis or   occlusion.     CTA neck:     1. ?No cervical arterial stenosis or occlusion.   2. ?Cervical spondylosis with at least moderate to marked multilevel   neural foraminal stenosis.   3. ?Nonerosive left otomastoiditis.     MRI from 01/30/2018 reviewed by myself:      IMPRESSION     1. ?Normal MRI of the brain.   2. ?Left mastoid and middle ear effusion.     Testing:    Audio date 09/28/2019  Right 20 dB 92%  Left 60 dB 96%           Assessment and Plan:  Jennifer Hancock is a 55 year old female who presents with history of chronic draining left ear with large central perforation with active infection present today. Ear dedribement under otomicroscopy. The ear is actively infected. PO augmentin  and sulfacetamide drops left ear 2wks Follow up in clinic in 2-3 weeks. Of noted patient insisted on obtaining narcotic pain medication which we do not use.  She was offered a variety of non steroidal anti inflammatories.

## 2020-04-17 ENCOUNTER — Encounter: Admit: 2020-04-17 | Discharge: 2020-04-17 | Payer: Medicaid Other | Primary: Family

## 2020-04-17 ENCOUNTER — Ambulatory Visit: Admit: 2020-04-17 | Discharge: 2020-04-17 | Payer: Medicaid Other | Primary: Family

## 2020-04-17 DIAGNOSIS — B1920 Unspecified viral hepatitis C without hepatic coma: Secondary | ICD-10-CM

## 2020-04-17 DIAGNOSIS — H7190 Unspecified cholesteatoma, unspecified ear: Secondary | ICD-10-CM

## 2020-04-17 DIAGNOSIS — H9012 Conductive hearing loss, unilateral, left ear, with unrestricted hearing on the contralateral side: Secondary | ICD-10-CM

## 2020-04-17 DIAGNOSIS — H7292 Unspecified perforation of tympanic membrane, left ear: Secondary | ICD-10-CM

## 2020-04-17 NOTE — Progress Notes
Date of Service: 04/17/2020    Subjective:             Jennifer Hancock is a 54 y.o. female.    History of Present Illness    Drainage has resolved. No further pain     Review of Systems   Constitutional: Negative.    HENT: Negative.    Eyes: Negative.    Respiratory: Negative.    Cardiovascular: Negative.    Gastrointestinal: Negative.    Endocrine: Negative.    Genitourinary: Negative.    Musculoskeletal: Negative.    Skin: Negative.    Allergic/Immunologic: Negative.    Neurological: Negative.    Hematological: Negative.    Psychiatric/Behavioral: Negative.          Objective:         ? buPROPion XL (WELLBUTRIN XL) 150 mg tablet Take one tablet by mouth daily. Do not crush or chew.   ? busPIRone (BUSPAR) 10 mg tablet Take 10 mg by mouth twice daily.   ? hydrOXYzine (ATARAX) 50 mg tablet Take one tablet by mouth every 6 hours as needed.   ? metroNIDAZOLE (FLAGYL) 500 mg tablet Take one tablet by mouth twice daily. Take with food. Do not drink alcohol while on metronidazole.   ? nicotine (NICODERM CQ STEP 1) 21 mg/day patch Apply one patch to top of skin as directed daily. Rotate patch location.  Indications: stop smoking   ? nicotine polacrilex (NICORETTE) 4 mg gum Place one each inside cheek (side of mouth) every 1 hour as needed. Chew to soften and park in mouth between lip and gum. May use 1 piece per hour, not to exceed 24 per day for 12 weeks. May be used longer, if needed.   ? risperiDONE (RISPERDAL) 1 mg tablet Take one tablet by mouth twice daily.   ? sulfacetamide (BLEPH-10) 10 % ophthalmic solution Apply two drops to three drops to left eye as directed twice daily. Apply to left ear 2-3 drops twice daily   ? traMADoL (ULTRAM) 50 mg tablet Take one tablet by mouth every 6 hours as needed.   ? traZODone (DESYREL) 100 mg tablet Take 50-100 mg by mouth at bedtime as needed.     Vitals:    04/17/20 0939   BP: 120/81   Pulse: 99   Weight: 74.8 kg (165 lb)   Height: 165.1 cm (65)   PainSc: Seven Body mass index is 27.46 kg/m?Marland Kitchen     Physical Exam    A&Ox3  Healthy appearing; Normal speech  No nystagmus  VII intact, symmetric    Exam under microscope:     AS:  EAC  Nl  TM anterior perforation with dry ME.  Posterior drum segment is lateralized.  Malleus not well seen.  No kertatin debris.       Assessment and Plan:    Left TM perforation with multiple repairs.  Infection resolved.  Will schedule for left tympanomastoidectomy with cartilage graft.

## 2020-04-19 ENCOUNTER — Encounter: Admit: 2020-04-19 | Discharge: 2020-04-19 | Payer: Medicaid Other | Primary: Family

## 2020-04-19 DIAGNOSIS — Z23 Encounter for immunization: Secondary | ICD-10-CM

## 2020-04-19 DIAGNOSIS — H7292 Unspecified perforation of tympanic membrane, left ear: Secondary | ICD-10-CM

## 2020-04-19 DIAGNOSIS — H9012 Conductive hearing loss, unilateral, left ear, with unrestricted hearing on the contralateral side: Secondary | ICD-10-CM

## 2020-04-20 ENCOUNTER — Ambulatory Visit: Admit: 2020-04-20 | Discharge: 2020-04-20 | Payer: Medicaid Other | Primary: Family

## 2020-04-20 DIAGNOSIS — H7292 Unspecified perforation of tympanic membrane, left ear: Secondary | ICD-10-CM

## 2020-04-20 DIAGNOSIS — H9012 Conductive hearing loss, unilateral, left ear, with unrestricted hearing on the contralateral side: Secondary | ICD-10-CM

## 2020-05-26 ENCOUNTER — Encounter: Admit: 2020-05-26 | Discharge: 2020-05-26 | Payer: Medicaid Other | Primary: Family

## 2020-05-26 DIAGNOSIS — H7190 Unspecified cholesteatoma, unspecified ear: Secondary | ICD-10-CM

## 2020-05-26 DIAGNOSIS — B1920 Unspecified viral hepatitis C without hepatic coma: Secondary | ICD-10-CM

## 2020-06-12 ENCOUNTER — Encounter: Admit: 2020-06-12 | Discharge: 2020-06-12 | Payer: Medicaid Other | Primary: Family

## 2020-06-12 NOTE — Telephone Encounter
Left message on Thurday evening requesting a return call regarding a covid lot number shot.

## 2020-06-12 NOTE — Telephone Encounter
Covid vaccine details updated.

## 2020-06-13 ENCOUNTER — Encounter: Admit: 2020-06-13 | Discharge: 2020-06-13 | Payer: Medicaid Other | Primary: Family

## 2020-06-13 ENCOUNTER — Ambulatory Visit: Admit: 2020-06-13 | Discharge: 2020-06-13 | Payer: Medicaid Other | Primary: Family

## 2020-06-13 DIAGNOSIS — B1920 Unspecified viral hepatitis C without hepatic coma: Secondary | ICD-10-CM

## 2020-06-13 DIAGNOSIS — H7190 Unspecified cholesteatoma, unspecified ear: Secondary | ICD-10-CM

## 2020-06-13 MED ORDER — ONDANSETRON HCL (PF) 4 MG/2 ML IJ SOLN
INTRAVENOUS | 0 refills | Status: DC
Start: 2020-06-13 — End: 2020-06-13
  Administered 2020-06-13: 15:00:00 4 mg via INTRAVENOUS

## 2020-06-13 MED ORDER — OFLOXACIN 0.3 % OT DROP
5 [drp] | Freq: Two times a day (BID) | OTIC | 3 refills | Status: CN
Start: 2020-06-13 — End: ?

## 2020-06-13 MED ORDER — HYDROMORPHONE (PF) 2 MG/ML IJ SYRG
.5-1 mg | INTRAVENOUS | 0 refills | Status: DC | PRN
Start: 2020-06-13 — End: 2020-06-13
  Administered 2020-06-13: 16:00:00 1 mg via INTRAVENOUS

## 2020-06-13 MED ORDER — LIDOCAINE 1%-EPINEPHRINE 1:100000 (BUFFERED) VIAL
0 refills | Status: DC
Start: 2020-06-13 — End: 2020-06-13
  Administered 2020-06-13 (×2): 5 mL via INTRAMUSCULAR

## 2020-06-13 MED ORDER — FENTANYL CITRATE (PF) 50 MCG/ML IJ SOLN
25-50 ug | INTRAVENOUS | 0 refills | Status: DC | PRN
Start: 2020-06-13 — End: 2020-06-13
  Administered 2020-06-13: 16:00:00 50 ug via INTRAVENOUS

## 2020-06-13 MED ORDER — TRAMADOL 50 MG PO TAB
50 mg | ORAL_TABLET | ORAL | 0 refills | Status: CN | PRN
Start: 2020-06-13 — End: ?

## 2020-06-13 MED ORDER — SULFACETAMIDE SODIUM 10 % OP DROP
0 refills | Status: DC
Start: 2020-06-13 — End: 2020-06-13
  Administered 2020-06-13: 14:00:00 20 [drp] via OPHTHALMIC

## 2020-06-13 MED ORDER — REMIFENTANYL 1000MCG IN NS 20ML (OR)
INTRAVENOUS | 0 refills | Status: DC
Start: 2020-06-13 — End: 2020-06-13
  Administered 2020-06-13 (×2): .1 ug/kg/min via INTRAVENOUS

## 2020-06-13 MED ORDER — CEPHALEXIN 500 MG PO CAP
500 mg | ORAL_CAPSULE | Freq: Three times a day (TID) | ORAL | 0 refills | Status: AC
Start: 2020-06-13 — End: ?

## 2020-06-13 MED ORDER — EPINEPHRINE 1 MG/ML IJ SOLN
0 refills | Status: DC
Start: 2020-06-13 — End: 2020-06-13
  Administered 2020-06-13: 14:00:00 2 mL

## 2020-06-13 MED ORDER — PROMETHAZINE 25 MG/ML IJ SOLN
6.25 mg | INTRAVENOUS | 0 refills | Status: DC | PRN
Start: 2020-06-13 — End: 2020-06-13

## 2020-06-13 MED ORDER — SUCCINYLCHOLINE CHLORIDE 20 MG/ML IJ SOLN
INTRAVENOUS | 0 refills | Status: DC
Start: 2020-06-13 — End: 2020-06-13
  Administered 2020-06-13: 13:00:00 80 mg via INTRAVENOUS

## 2020-06-13 MED ORDER — PHENYLEPHRINE HCL IN 0.9% NACL 1 MG/10 ML (100 MCG/ML) IV SYRG
INTRAVENOUS | 0 refills | Status: DC
Start: 2020-06-13 — End: 2020-06-13
  Administered 2020-06-13 (×4): 100 ug via INTRAVENOUS

## 2020-06-13 MED ORDER — CEFAZOLIN 1 GRAM IJ SOLR
INTRAVENOUS | 0 refills | Status: DC
Start: 2020-06-13 — End: 2020-06-13
  Administered 2020-06-13: 13:00:00 2 g via INTRAVENOUS

## 2020-06-13 MED ORDER — FENTANYL CITRATE (PF) 50 MCG/ML IJ SOLN
INTRAVENOUS | 0 refills | Status: DC
Start: 2020-06-13 — End: 2020-06-13
  Administered 2020-06-13: 13:00:00 100 ug via INTRAVENOUS

## 2020-06-13 MED ORDER — DIPHENHYDRAMINE HCL 50 MG/ML IJ SOLN
25 mg | Freq: Once | INTRAVENOUS | 0 refills | Status: DC | PRN
Start: 2020-06-13 — End: 2020-06-13

## 2020-06-13 MED ORDER — OXYCODONE-ACETAMINOPHEN 7.5-325 MG PO TAB
1 | ORAL_TABLET | ORAL | 0 refills | 2.00000 days | Status: AC | PRN
Start: 2020-06-13 — End: ?

## 2020-06-13 MED ORDER — PROPOFOL INJ 10 MG/ML IV VIAL
INTRAVENOUS | 0 refills | Status: DC
Start: 2020-06-13 — End: 2020-06-13
  Administered 2020-06-13: 13:00:00 180 mg via INTRAVENOUS

## 2020-06-13 MED ORDER — LIDOCAINE (PF) 10 MG/ML (1 %) IJ SOLN
.2 mL | INTRAMUSCULAR | 0 refills | Status: DC | PRN
Start: 2020-06-13 — End: 2020-06-13

## 2020-06-13 MED ORDER — PROPOFOL 10 MG/ML IV EMUL 100 ML (INFUSION)(AM)(OR)
INTRAVENOUS | 0 refills | Status: DC
Start: 2020-06-13 — End: 2020-06-13
  Administered 2020-06-13: 13:00:00 130 ug/kg/min via INTRAVENOUS

## 2020-06-13 MED ORDER — ARTIFICIAL TEARS SINGLE DOSE DROPS GROUP
OPHTHALMIC | 0 refills | Status: DC
Start: 2020-06-13 — End: 2020-06-13
  Administered 2020-06-13: 13:00:00 2 [drp] via OPHTHALMIC

## 2020-06-13 MED ORDER — ACETAMINOPHEN 1,000 MG/100 ML (10 MG/ML) IV SOLN
INTRAVENOUS | 0 refills | Status: DC
Start: 2020-06-13 — End: 2020-06-13
  Administered 2020-06-13: 13:00:00 1000 mg via INTRAVENOUS

## 2020-06-13 MED ORDER — DEXAMETHASONE SODIUM PHOSPHATE 4 MG/ML IJ SOLN
INTRAVENOUS | 0 refills | Status: DC
Start: 2020-06-13 — End: 2020-06-13
  Administered 2020-06-13: 13:00:00 10 mg via INTRAVENOUS

## 2020-06-13 MED ORDER — LACTATED RINGERS IV SOLP
1000 mL | INTRAVENOUS | 0 refills | Status: DC
Start: 2020-06-13 — End: 2020-06-13
  Administered 2020-06-13: 12:00:00 1000 mL via INTRAVENOUS

## 2020-06-13 MED ORDER — BACITRACIN ZINC 500 UNIT/GRAM TP OINT
0 refills | Status: DC
Start: 2020-06-13 — End: 2020-06-13
  Administered 2020-06-13: 14:00:00 1 via TOPICAL

## 2020-06-13 MED ORDER — HALOPERIDOL LACTATE 5 MG/ML IJ SOLN
1 mg | Freq: Once | INTRAVENOUS | 0 refills | Status: DC | PRN
Start: 2020-06-13 — End: 2020-06-13

## 2020-06-13 MED ORDER — OXYCODONE 5 MG PO TAB
5-10 mg | Freq: Once | ORAL | 0 refills | Status: CP | PRN
Start: 2020-06-13 — End: ?
  Administered 2020-06-13: 16:00:00 10 mg via ORAL

## 2020-06-13 MED ORDER — SULFACETAMIDE SODIUM 10 % OP DROP
Freq: Two times a day (BID) | OPHTHALMIC | 1 refills | Status: AC
Start: 2020-06-13 — End: ?

## 2020-06-13 MED ORDER — LIDOCAINE (PF) 20 MG/ML (2 %) IJ SOLN
INTRAVENOUS | 0 refills | Status: DC
Start: 2020-06-13 — End: 2020-06-13
  Administered 2020-06-13: 13:00:00 80 mg via INTRAVENOUS

## 2020-06-13 MED ADMIN — FENTANYL CITRATE (PF) 50 MCG/ML IJ SOLN [3037]: 50 ug | INTRAVENOUS | @ 15:00:00 | Stop: 2020-06-13 | NDC 00641602701

## 2020-06-13 NOTE — Telephone Encounter
Patient called with extreme pain after surgery this morning. She stated that she only had a few pills of the tranzodone and tramadol is not helpful. She wold prefer something stronger but just two days worth, she is hopeful after that the antibiotics will start working on the infection process. Pharmacy was updated to [facility] in [place], [place].

## 2020-06-14 ENCOUNTER — Encounter: Admit: 2020-06-14 | Discharge: 2020-06-14 | Payer: Medicaid Other | Primary: Family

## 2020-06-14 NOTE — Telephone Encounter
Patient called to speak with nurse with a post surgery question.  She wanted to confirm if she can take the bandages off.

## 2020-06-15 ENCOUNTER — Encounter: Admit: 2020-06-15 | Discharge: 2020-06-15 | Payer: Medicaid Other | Primary: Family

## 2020-06-15 DIAGNOSIS — B1920 Unspecified viral hepatitis C without hepatic coma: Secondary | ICD-10-CM

## 2020-06-15 DIAGNOSIS — H7190 Unspecified cholesteatoma, unspecified ear: Secondary | ICD-10-CM

## 2020-06-16 ENCOUNTER — Encounter: Admit: 2020-06-16 | Discharge: 2020-06-16 | Payer: Medicaid Other | Primary: Family

## 2020-06-16 NOTE — Telephone Encounter
Went over postoperative instructions with patient. Patient understood and will follow up with any additional questions or concerns.

## 2020-06-16 NOTE — Telephone Encounter
Patient called to speak with nurse regarding post surgery instructions prior to the weekend.

## 2020-06-29 ENCOUNTER — Ambulatory Visit: Admit: 2020-06-29 | Discharge: 2020-06-29 | Payer: Medicaid Other | Primary: Family

## 2020-06-29 ENCOUNTER — Encounter: Admit: 2020-06-29 | Discharge: 2020-06-29 | Payer: Medicaid Other | Primary: Family

## 2020-06-29 DIAGNOSIS — H7190 Unspecified cholesteatoma, unspecified ear: Secondary | ICD-10-CM

## 2020-06-29 DIAGNOSIS — B1920 Unspecified viral hepatitis C without hepatic coma: Secondary | ICD-10-CM

## 2020-06-29 MED ORDER — SULFACETAMIDE SODIUM 10 % OP DROP
2 [drp] | Freq: Every day | OPHTHALMIC | 1 refills | 25.00000 days | Status: AC
Start: 2020-06-29 — End: ?

## 2020-07-06 ENCOUNTER — Encounter: Admit: 2020-07-06 | Discharge: 2020-07-06 | Payer: Medicaid Other | Primary: Family

## 2020-07-06 DIAGNOSIS — H7190 Unspecified cholesteatoma, unspecified ear: Secondary | ICD-10-CM

## 2020-07-06 DIAGNOSIS — B1920 Unspecified viral hepatitis C without hepatic coma: Secondary | ICD-10-CM

## 2020-10-05 ENCOUNTER — Encounter: Admit: 2020-10-05 | Discharge: 2020-10-05 | Payer: Medicaid Other | Primary: Family

## 2020-10-05 ENCOUNTER — Ambulatory Visit: Admit: 2020-10-05 | Discharge: 2020-10-05 | Payer: Medicaid Other | Primary: Family

## 2020-10-05 DIAGNOSIS — B1920 Unspecified viral hepatitis C without hepatic coma: Secondary | ICD-10-CM

## 2020-10-05 DIAGNOSIS — H7190 Unspecified cholesteatoma, unspecified ear: Secondary | ICD-10-CM

## 2020-10-05 DIAGNOSIS — H7010 Chronic mastoiditis, unspecified ear: Secondary | ICD-10-CM

## 2020-10-05 NOTE — Progress Notes
Date of Service: 10/05/2020    Subjective:             Jennifer Hancock is a 54 y.o. female.    History of Present Illness    Pain resolved.  Is having some drainage from left ear.       Review of Systems   Constitutional: Negative.    HENT: Negative.    Eyes: Negative.    Respiratory: Negative.    Cardiovascular: Negative.    Gastrointestinal: Negative.    Endocrine: Negative.    Genitourinary: Negative.    Musculoskeletal: Negative.    Skin: Negative.    Allergic/Immunologic: Negative.    Neurological: Negative.    Hematological: Negative.    Psychiatric/Behavioral: Negative.          Objective:         ? ALPRAZolam (XANAX) 0.5 mg tablet Take 0.5 mg by mouth three times daily as needed for Anxiety.   ? duloxetine HCl (CYMBALTA PO) Take  by mouth.   ? oxyCODONE-acetaminophen (ENDOCET) 7.5-325 mg tablet Take one tablet by mouth every 6 hours as needed for Pain Indications: pain   ? sulfacetamide (BLEPH-10) 10 % ophthalmic solution Place two drops into or around eye(s) daily.   ? sulfacetamide (BLEPH-10) 10 % ophthalmic solution 3 drops in left ear BID starting 1 week postop   ? traMADoL (ULTRAM) 50 mg tablet Take one tablet by mouth every 6 hours as needed.   ? traZODone (DESYREL) 100 mg tablet Take 50-100 mg by mouth at bedtime as needed.     Vitals:    10/05/20 1055   BP: (!) 142/95   Pulse: 94   Weight: 72.6 kg (160 lb)   Height: 165.1 cm (65)   PainSc: Four     Body mass index is 26.63 kg/m?Marland Kitchen     Physical Exam    A&Ox3  Healthy appearing; Normal speech  No nystagmus  VII intact, symmetric      The Left  ear shows squamous debris in the mastoid cavity.  This was cleaned up under direct vision with an operating microscope and a Hartman forceps. There is a mucosalized patch of tissue in the epiympanum.  Boric acid powder was instilled into the cavity.         Assessment and Plan:    The left mastoid cavity was cleaned today and boric acid powder placed.  I would like to follow her at 3 month intervals and will consider cauterized the area of mucosa if she continues to drain

## 2021-02-12 ENCOUNTER — Encounter: Admit: 2021-02-12 | Discharge: 2021-02-12 | Payer: Medicaid Other | Primary: Family

## 2021-02-14 ENCOUNTER — Encounter: Admit: 2021-02-14 | Discharge: 2021-02-14 | Payer: Medicaid Other | Primary: Family

## 2021-04-19 ENCOUNTER — Encounter: Admit: 2021-04-19 | Discharge: 2021-04-19 | Payer: Medicaid Other | Primary: Family

## 2021-05-07 ENCOUNTER — Encounter: Admit: 2021-05-07 | Discharge: 2021-05-07 | Payer: Medicaid Other | Primary: Family

## 2021-05-07 ENCOUNTER — Ambulatory Visit: Admit: 2021-05-07 | Discharge: 2021-05-07 | Payer: Medicaid Other | Primary: Family

## 2021-05-07 DIAGNOSIS — G9589 Other specified diseases of spinal cord: Secondary | ICD-10-CM

## 2021-05-07 DIAGNOSIS — H7190 Unspecified cholesteatoma, unspecified ear: Secondary | ICD-10-CM

## 2021-05-07 DIAGNOSIS — B1920 Unspecified viral hepatitis C without hepatic coma: Secondary | ICD-10-CM

## 2021-05-07 MED ORDER — MELOXICAM 7.5 MG PO TAB
7.5 mg | ORAL_TABLET | Freq: Every day | ORAL | 1 refills | 30.00000 days | Status: AC
Start: 2021-05-07 — End: ?

## 2021-05-07 MED ORDER — METHOCARBAMOL 750 MG PO TAB
750 mg | ORAL_TABLET | Freq: Three times a day (TID) | ORAL | 3 refills | Status: AC
Start: 2021-05-07 — End: ?

## 2021-05-07 MED ORDER — CEFAZOLIN IN 0.9% SOD CHLORIDE 2 GRAM/110 ML IVPB
2 g | Freq: Once | INTRAVENOUS | 0 refills
Start: 2021-05-07 — End: ?

## 2021-05-07 NOTE — Patient Instructions
[facility]a will call you within 5-7 business days to schedule surgery  Lonzo Candy, [facility]  FUS Coordinator  [facility],Cheng,Nazzaro  [facility]   Ph: [phone]   Fax: [phone]    For up to date information on the COVID-19 virus, visit the [facility] website. [email or web address]  ? General supportive care during cold and flu season and infection prevention reminders:    o Wash hands often with soap and water for at least 20 seconds   o Cover your mouth and nose   o Social distancing: try to maintain 6 feet between you and other people   o Stay home if sick and symptoms mild or manageable?  ? If you must be around people wear a mask    ? If you are having symptoms of a lower respiratory infection (cough, shortness of breath) and/or fever AND either traveled in last 30 days (internationally or to region of exposure) OR known exposure to patient with COVID19:     o Call your primary care provider for questions or health needs.   ? Tell your doctor about your recent travel and your symptoms     o In a medical emergency, call 911 or go to the nearest emergency room.      Standard Precautions: Handwashing  Frequent and thorough handwashing is the best way to prevent infection. The sooner you wash your hands after exposure, the less likely you are to catch or spread infection.      Washing your hands is the best way to stop the spread of infection.   When to wash your hands  Wash your hands regularly throughout the day, especially:   ? When first arriving at work and before leaving  ? Before and after caring for a patient  ? After touching blood or any other body fluid or substance, broken skin, or mucous membranes  ? After touching an object or surface that is or may be contaminated  ? Before and after eating, drinking, smoking, and after using the restroom  ? After coughing, sneezing, or blowing your nose  How to wash your hands  First, carefully remove gloves and other PPE. Follow your facility?s guidelines for dealing with jewelry. Then follow these steps:   ? Use clean, running water and plenty of soap. Work up a good Education officer, museum by rubbing your hands together.  ? Clean your whole hand, under your nails, between your fingers, and up your wrists. Rub vigorously. Wash for at least 20?seconds (or the time it takes to sing happy birthday twice. (CDC).  ? Rinse your hands well. Let the water run off your fingertips, not up your wrists.  ? Dry your hands well with clean paper towels. Or use an air dryer machine. Use paper towels to turn off the faucet and open the door so you don?t recontaminate your hands.  ? If no sink is available or your hands are not visibly soiled, use the alcohol-based hand sanitizer approved by your facility. Be sure it contains no less than 60% alcohol. These products are fast-acting and significantly reduce the number of germs on the skin. Unfortunately, they don't work on all types of germs in the hospital.?Wash with soap and water as soon as you can.  [employer] last reviewed this educational content on 06/25/2020  ? 2000-2021 [employer], [place]. All rights reserved. This information is not intended as a substitute for professional medical care. Always follow your healthcare professional's instructions.  Falls Can Be Prevented  Elderly Trauma Patients  Talk to Your Doctor  ? Talk to your healthcare provider to assess your risk for falling. Ask them how you can prevent falling.  ? Ask your healthcare provider or pharmacist to review your medicines. ?Some medicine combinations might make you dizzy or sleepy. This should include prescription medicines and over-the-counter medicines.  ? Ask your healthcare provider about taking vitamin D supplements.  ?  Do Strength and Balance Exercises  ? Do exercises that make your legs stronger and improve your balance. Tai Chi is a good example of this kind of exercise.  ?  Have Your Eyes Checked  ? Have your eyes checked by an eye doctor once a year. Update your eyeglasses if needed.  ? Bifocals can make things seem closer or farther away than they really are. This can affect your balance and make you more likely to fall. If you use bifocals, have a pair of glasses without bifocals for outdoor activities, such as walking.  ?  Make Your Home Safer  ? Remove things you could trip over.  ? Add grab bars inside and outside your tub or shower and next to the toilet.  ? Put railings on both sides of stairs.  ? Make sure your home has lots of light by adding more or brighter light bulbs.

## 2021-05-08 ENCOUNTER — Encounter: Admit: 2021-05-08 | Discharge: 2021-05-08 | Payer: Medicaid Other | Primary: Family

## 2021-05-08 DIAGNOSIS — G9589 Other specified diseases of spinal cord: Secondary | ICD-10-CM

## 2021-05-08 NOTE — Telephone Encounter
Patient called into clinic and left a voicemail for this RN stating that she had more questions about her surgery. This RN called patient back and spoke to patient. Patient stated that she would like some literature about her schwannoma and her surgery. This RN will find some literature for patient and mail to her as she asked. No further questions or concerns at this time.

## 2021-05-09 ENCOUNTER — Encounter: Admit: 2021-05-09 | Discharge: 2021-05-09 | Payer: Medicaid Other | Primary: Family

## 2021-05-09 NOTE — Telephone Encounter
I contacted Jennifer Hancock in regards scheduling her surgery with Dr. Lorel Monaco for next opening :06/20/21. Per Jennifer Hancock she is currently waiting on additional information to read more in regards to the Schwannoma. I told Jennifer Hancock that once she has read the additional information she can reach out to our office when she is ready to schedule her surgery. Patient wanted the Surgery date to be placed on whole and I mentioned that I was unable to do , but once she was ready to scheduled her surgery we could look at some surgery dates. Patient asked me if I thought it was reasonable to request additional information before proceeding with surgery. I told her I cant provide my personal/professional opinion. I repeated to Jennifer Hancock that she may contact us once she is ready to proceed in scheduling her surgery and provided her with our phone number [phone]. Patient verbalized understanding.

## 2021-05-25 ENCOUNTER — Encounter: Admit: 2021-05-25 | Discharge: 2021-05-25 | Payer: Medicaid Other | Primary: Family

## 2021-05-25 NOTE — Telephone Encounter
Sent Surgery Information and Appt information to pt by mail, verifed address.     Requested PMI-  DOS: 07/02/21 w/ Dr. Lorel Monaco.

## 2021-06-18 ENCOUNTER — Encounter: Admit: 2021-06-18 | Discharge: 2021-06-18 | Payer: Medicaid Other | Primary: Family

## 2021-06-18 ENCOUNTER — Ambulatory Visit: Admit: 2021-06-18 | Discharge: 2021-06-18 | Payer: Medicaid Other | Primary: Family

## 2021-06-18 DIAGNOSIS — H7190 Unspecified cholesteatoma, unspecified ear: Secondary | ICD-10-CM

## 2021-06-18 DIAGNOSIS — Z01818 Encounter for other preprocedural examination: Secondary | ICD-10-CM

## 2021-06-18 DIAGNOSIS — B1920 Unspecified viral hepatitis C without hepatic coma: Secondary | ICD-10-CM

## 2021-06-18 DIAGNOSIS — Z8719 Personal history of other diseases of the digestive system: Secondary | ICD-10-CM

## 2021-06-18 LAB — COMPREHENSIVE METABOLIC PANEL
ALBUMIN: 3.9 g/dL (ref 3.5–5.0)
ALK PHOSPHATASE: 58 U/L (ref 25–110)
ALT: 13 U/L (ref 7–56)
ANION GAP: 6 (ref 3–12)
AST: 13 U/L (ref 7–40)
BLD UREA NITROGEN: 14 mg/dL (ref 7–25)
CALCIUM: 9.3 mg/dL (ref 8.5–10.6)
CHLORIDE: 99 MMOL/L (ref 98–110)
CO2: 34 MMOL/L — ABNORMAL HIGH (ref 21–30)
CREATININE: 0.6 mg/dL (ref 0.4–1.00)
EGFR: >60 mL/min (ref >60)
GLUCOSE,PANEL: 82 mg/dL (ref 70–100)
POTASSIUM: 4.4 MMOL/L (ref 3.5–5.1)
SODIUM: 139 MMOL/L (ref 137–147)
TOTAL BILIRUBIN: 0.2 mg/dL — ABNORMAL LOW (ref 0.3–1.2)
TOTAL PROTEIN: 6.6 g/dL (ref 6.0–8.0)

## 2021-06-18 LAB — CBC: WBC COUNT: 6.8 K/UL (ref 4.5–11.0)

## 2021-07-04 ENCOUNTER — Encounter: Admit: 2021-07-04 | Discharge: 2021-07-04 | Payer: Medicaid Other | Primary: Family

## 2021-07-04 DIAGNOSIS — Z8719 Personal history of other diseases of the digestive system: Secondary | ICD-10-CM

## 2021-07-04 DIAGNOSIS — B1920 Unspecified viral hepatitis C without hepatic coma: Secondary | ICD-10-CM

## 2021-07-04 DIAGNOSIS — H7190 Unspecified cholesteatoma, unspecified ear: Secondary | ICD-10-CM

## 2021-07-04 MED ADMIN — SODIUM CHLORIDE 0.9 % IV SOLP [27838]: 1000 mL | INTRAVENOUS | @ 16:00:00 | Stop: 2021-07-04 | NDC 00338004904

## 2021-07-05 ENCOUNTER — Encounter: Admit: 2021-07-05 | Discharge: 2021-07-05 | Payer: Medicaid Other | Primary: Family

## 2021-07-05 NOTE — Telephone Encounter
Patient called into clinic and left a voicemail for this RN to return her call to talk about when can she reschedule her surgery. This RN discussed patient with Dr. Lorel Monaco and he would like to have her show 6 months at least of clean ua's in order to reconsider rescheduling her surgery. Patient showed positive for amphetamines yesterday during pre-op. Patient stated to this RN that she took a friend's adderrall rx for a few days before surgery due to her anxiety and need to clean her house. Patient asked if she could have the ua's done at her pcp and this RN let her know that yes she could, but we would need the results faxed to [place]. This RN also asked patient if she would like a referral to an addiction doctor to help her and she declined this at this time stating that she was not an addict and this was a one time occurrence. Patient asked if this RN could guarantee that he would reschedule her surgery and this RN let her know that she would have to discuss that with Dr. Lorel Monaco. Patient said ok and hung up. This RN relayed the conversation to Dr. Lorel Monaco and he suggested patient make an appointment when she was ready to discuss this situation again.

## 2021-08-17 ENCOUNTER — Encounter: Admit: 2021-08-17 | Discharge: 2021-08-17 | Payer: Medicaid Other | Primary: Family

## 2021-10-26 ENCOUNTER — Encounter: Admit: 2021-10-26 | Discharge: 2021-10-26 | Payer: Medicaid Other | Primary: Family

## 2021-10-26 ENCOUNTER — Ambulatory Visit: Admit: 2021-10-26 | Discharge: 2021-10-26 | Payer: Medicaid Other | Primary: Family

## 2021-11-16 ENCOUNTER — Encounter: Admit: 2021-11-16 | Discharge: 2021-11-16 | Payer: Medicaid Other | Primary: Family

## 2021-11-20 ENCOUNTER — Encounter: Admit: 2021-11-20 | Discharge: 2021-11-20 | Payer: Medicaid Other | Primary: Family

## 2022-09-25 IMAGING — MR C-spine^Routine
7 series · 46 of 48 positions shown · non-contrast
Comparison: none

[Series 4: T2 · sagittal · 3.0mm · 0.54mm/px · 6 of 13 slices shown]
[im 1/13]
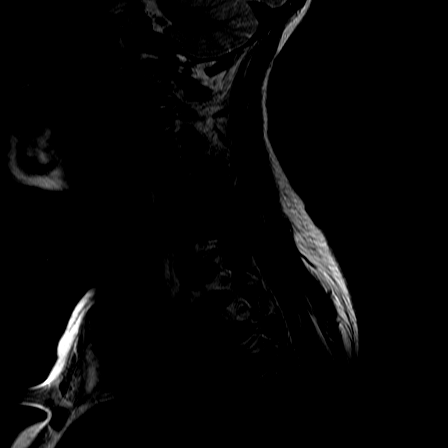
[im 3/13]
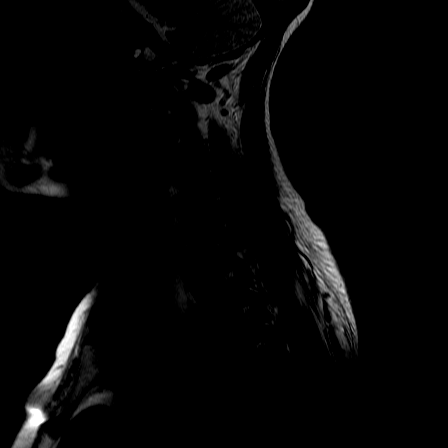
[im 5/13]
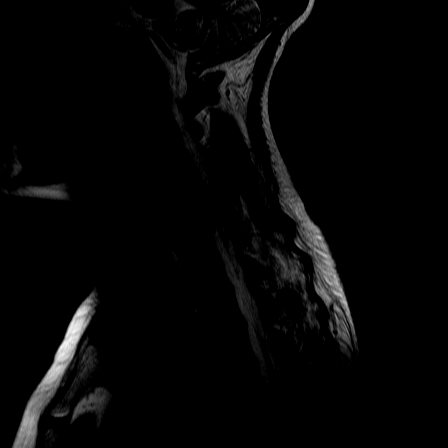
[im 8/13]
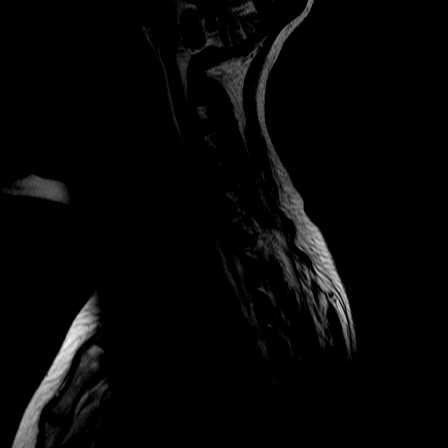
[im 10/13]
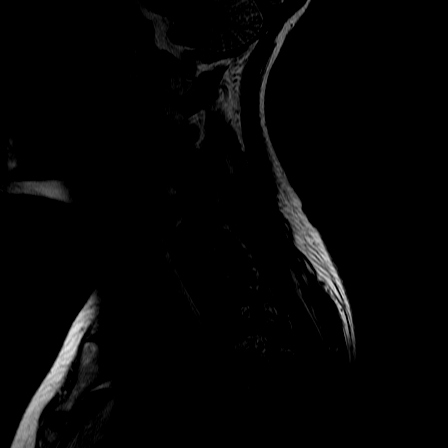
[im 13/13]
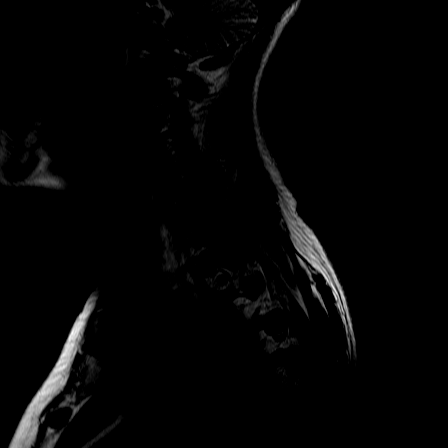

[Series 5: T1 · sagittal · 3.0mm · 0.75mm/px · 5 of 12 slices shown (1 of 2)]
[im 1/12]
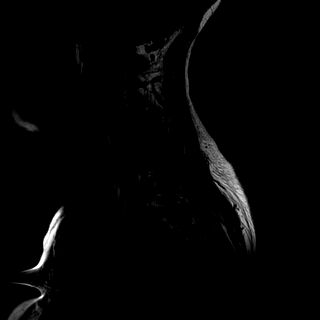
[im 3/12]
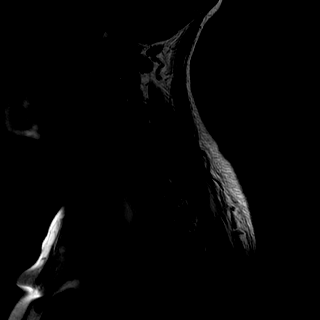
[im 6/12]
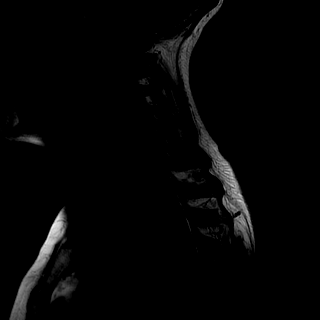
[im 9/12]
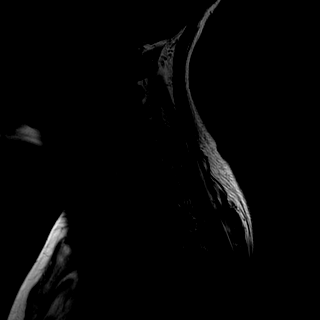
[im 12/12]
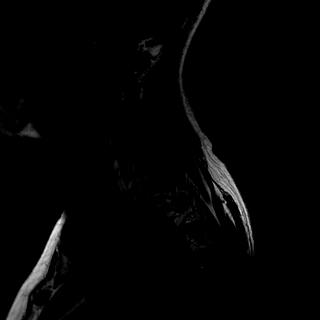

[Series 6: STIR · sagittal · 3.0mm · 0.94mm/px · 5 of 12 slices shown]
[im 1/12]
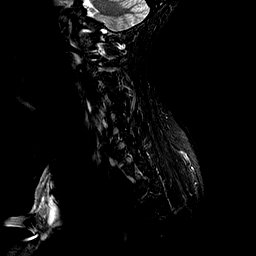
[im 3/12]
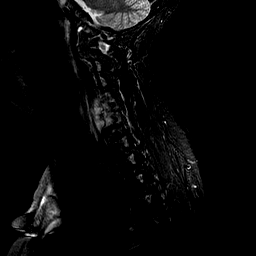
[im 6/12]
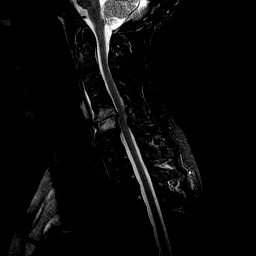
[im 9/12]
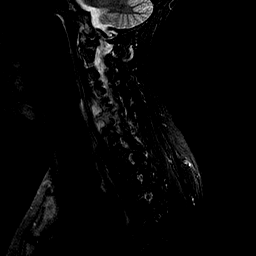
[im 12/12]
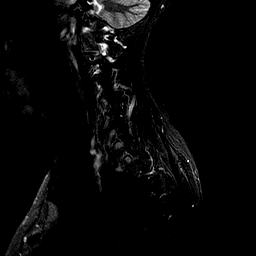

[Series 8: T1 · axial · 3.0mm · 0.78mm/px · z∈[-15,+70]mm · 9 of 22 slices shown (2 of 2)]
[im 1/22]
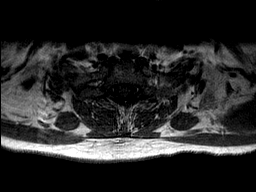
[im 3/22]
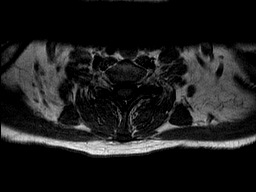
[im 6/22]
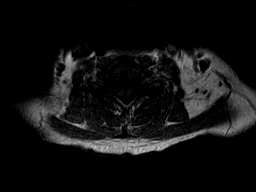
[im 8/22]
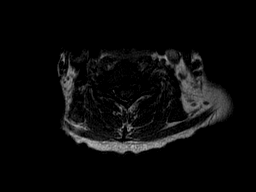
[im 11/22]
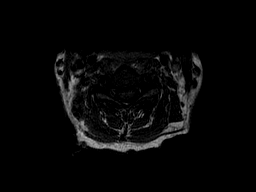
[im 14/22]
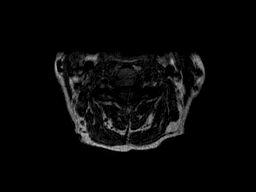
[im 16/22]
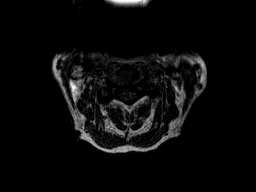
[im 19/22]
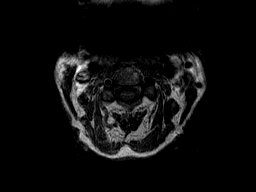
[im 22/22]
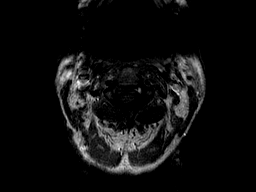

[Series 9: provider echo axial · axial · 3.0mm · 0.39mm/px · z∈[-15,+70]mm · 8 of 19 slices shown]
[im 1/19]
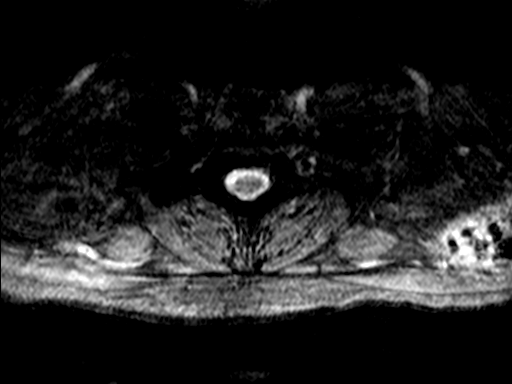
[im 3/19]
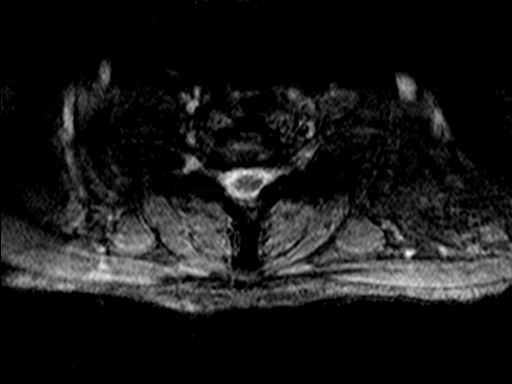
[im 6/19]
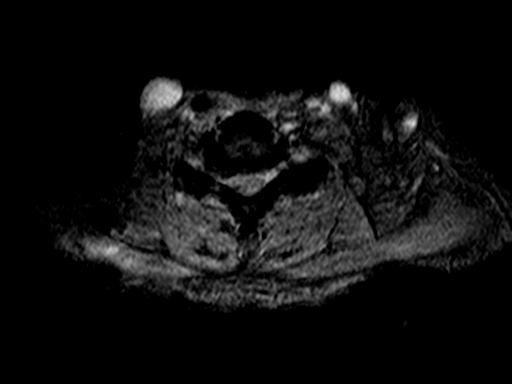
[im 8/19]
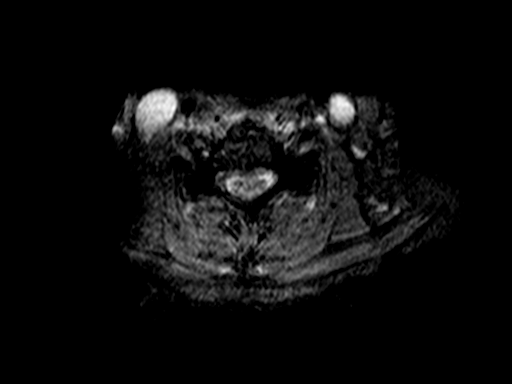
[im 11/19]
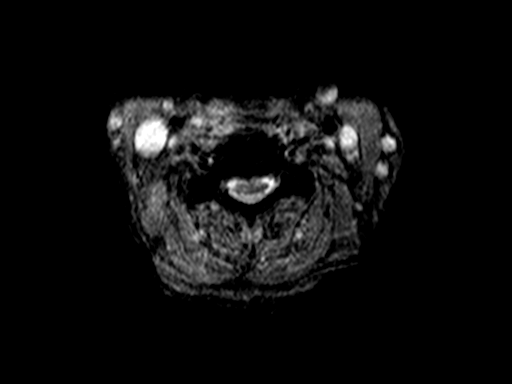
[im 13/19]
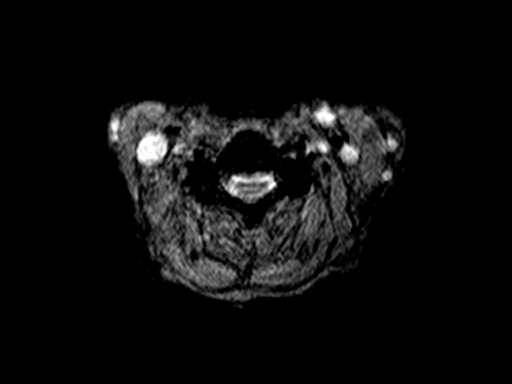
[im 16/19]
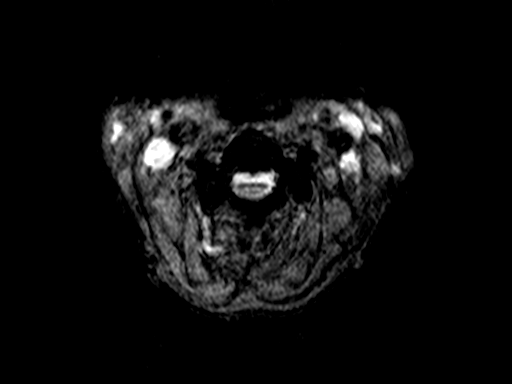
[im 19/19]
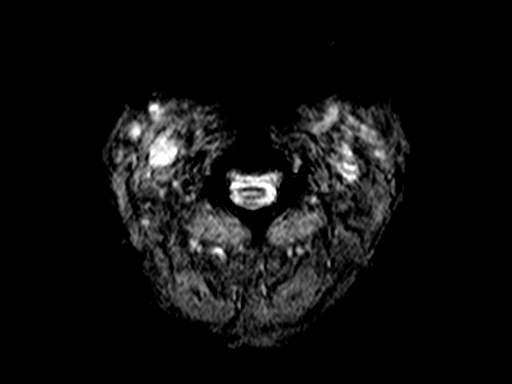

[Series 10: T1 post-contrast · sagittal · 3.0mm · 0.86mm/px · 5 of 11 slices shown (1 of 2)]
[im 1/11]
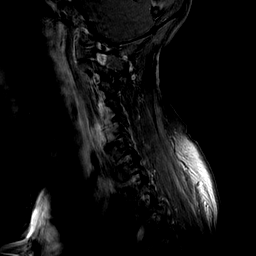
[im 3/11]
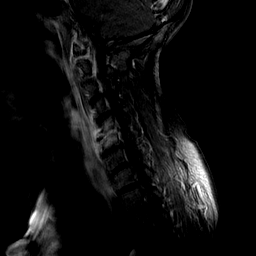
[im 6/11]
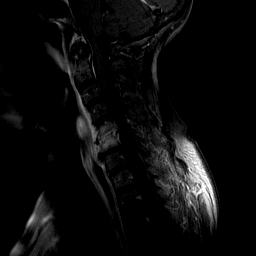
[im 8/11]
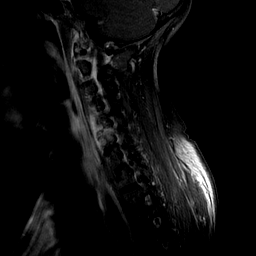
[im 11/11]
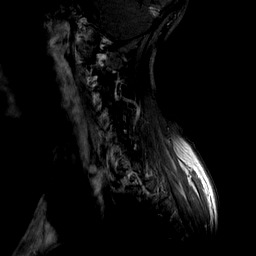

[Series 12: T1 post-contrast · axial · 3.0mm · 0.78mm/px · z∈[-12,+75]mm · 8 of 23 slices shown (2 of 2)]
[im 1/23]
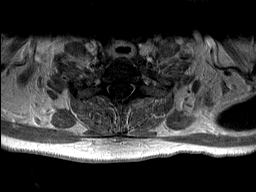
[im 3/23]
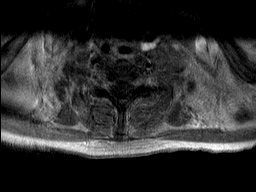
[im 8/23]
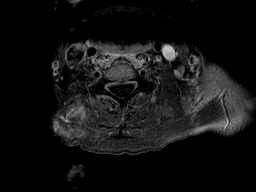
[im 10/23]
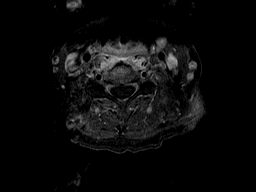
[im 13/23]
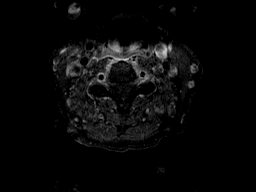
[im 15/23]
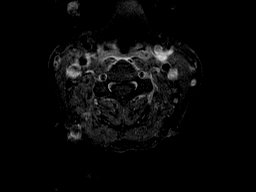
[im 20/23]
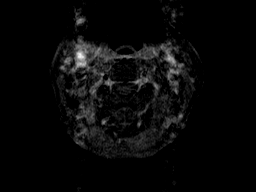
[im 23/23]
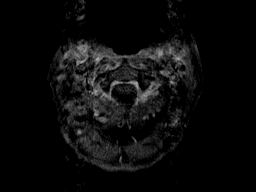

[46 of 48 positions shown; findings below may reference images not displayed]

EXAM

MR cervical spine wo/w con

INDICATION

neck pain- r/o osteomyelitis/diskitis
NECK PAIN, F/U ABNORMAL CT, PT HAD A GREAT DEAL OF DIFFICULTY STAYING STILL.

TECHNIQUE

Multiplanar multisequence MRI of the cervical spine without and with IV contrast

COMPARISONS

CT prior day

FINDINGS

C5-C6 vertebral body edema with enhancement and loss of the normal intervertebral disc space.
Findings are concerning for osteomyelitis/discitis. No epidural abscess is seen. There is
prevertebral edema, somewhat limited evaluation for enhancement due to inhomogeneous fat saturation.

C2-C3: Patent central canal and neural foramina.

C3-C4: Patent central canal and left neural foramina. Mild right neural foraminal narrowing.

C4-C5: Patent central canal and neural foramina.

C5-C6: Mild central canal and neural foraminal narrowing bilaterally.

C6-C7: Asymmetric mild disc bulge on the right with mild lateral recess stenosis and right neural
foraminal narrowing. Mild central canal narrowing.

C7-T1: Patent central canal and neural foramina.

IMPRESSION

C5-C6 osteomyelitis/discitis.

Other degenerative changes as above.

Tech Notes:

NECK PAIN, F/U ABNORMAL CT, PT HAD A GREAT DEAL OF DIFFICULTY STAYING STILL.

## 2023-02-23 ENCOUNTER — Encounter: Admit: 2023-02-23 | Discharge: 2023-02-23 | Payer: Medicaid Other | Primary: Family
